# Patient Record
Sex: Male | Born: 1940
Health system: Southern US, Community
[De-identification: ages and names within clinical notes are randomized; demographics above are authoritative.]

---

## 1999-01-03 ENCOUNTER — Ambulatory Visit (HOSPITAL_COMMUNITY): Admission: RE | Admit: 1999-01-03 | Discharge: 1999-01-03 | Payer: Self-pay | Admitting: Internal Medicine

## 1999-05-07 ENCOUNTER — Ambulatory Visit (HOSPITAL_COMMUNITY): Admission: RE | Admit: 1999-05-07 | Discharge: 1999-05-07 | Payer: Self-pay | Admitting: Gastroenterology

## 1999-05-07 ENCOUNTER — Encounter (INDEPENDENT_AMBULATORY_CARE_PROVIDER_SITE_OTHER): Payer: Self-pay

## 2003-11-08 ENCOUNTER — Encounter (INDEPENDENT_AMBULATORY_CARE_PROVIDER_SITE_OTHER): Payer: Self-pay | Admitting: *Deleted

## 2003-11-08 ENCOUNTER — Ambulatory Visit (HOSPITAL_COMMUNITY): Admission: RE | Admit: 2003-11-08 | Discharge: 2003-11-08 | Payer: Self-pay | Admitting: Gastroenterology

## 2007-12-01 ENCOUNTER — Ambulatory Visit (HOSPITAL_COMMUNITY): Admission: RE | Admit: 2007-12-01 | Discharge: 2007-12-01 | Payer: Self-pay | Admitting: Urology

## 2007-12-04 ENCOUNTER — Ambulatory Visit: Admission: RE | Admit: 2007-12-04 | Discharge: 2007-12-22 | Payer: Self-pay | Admitting: Radiation Oncology

## 2008-02-11 ENCOUNTER — Ambulatory Visit: Admission: RE | Admit: 2008-02-11 | Discharge: 2008-03-14 | Payer: Self-pay | Admitting: Radiation Oncology

## 2008-02-24 ENCOUNTER — Inpatient Hospital Stay (HOSPITAL_COMMUNITY): Admission: RE | Admit: 2008-02-24 | Discharge: 2008-02-25 | Payer: Self-pay | Admitting: Urology

## 2008-02-24 ENCOUNTER — Encounter (INDEPENDENT_AMBULATORY_CARE_PROVIDER_SITE_OTHER): Payer: Self-pay | Admitting: Urology

## 2008-12-16 ENCOUNTER — Ambulatory Visit (HOSPITAL_BASED_OUTPATIENT_CLINIC_OR_DEPARTMENT_OTHER): Admission: RE | Admit: 2008-12-16 | Discharge: 2008-12-16 | Payer: Self-pay | Admitting: Urology

## 2010-07-13 LAB — POCT I-STAT 4, (NA,K, GLUC, HGB,HCT)
HCT: 48 % (ref 39.0–52.0)
Hemoglobin: 16.3 g/dL (ref 13.0–17.0)
Potassium: 4.1 mEq/L (ref 3.5–5.1)

## 2010-08-21 NOTE — Discharge Summary (Signed)
NAMEFINNIAN, HUSTED                 ACCOUNT NO.:  [id number]   MEDICAL RECORD NO.:  [id number]          PATIENT TYPE:  INP   LOCATION:  1428                         FACILITY:  [facility]   PHYSICIAN:  Ronald L. Earlene Plater, M.D.  DATE OF BIRTH:  04-Feb-1941   DATE OF ADMISSION:  02/24/2008  DATE OF DISCHARGE:  02/25/2008                               DISCHARGE SUMMARY   ADMISSION DIAGNOSIS:  Adenocarcinoma of the prostate.   DISCHARGE DIAGNOSIS:  Adenocarcinoma of the prostate.   PROCEDURES:  1. Robotic-assisted laparoscopic radical prostatectomy, bilateral      nerve sparing.  2. Bilateral pelvic lymphadenopathy.   HISTORY AND PHYSICAL:  For full details, please see admission History  and Physical.  Briefly, Mr. Azer is a 70 year old gentleman who was  found to have clinically localized adenocarcinoma of the prostate.  After careful consideration regarding management options for treatment,  he elected to proceed with surgical therapy and a robotic-assisted  laparoscopic radical prostatectomy.   HOSPITAL COURSE:  On February 24, 2008, he was taken to the operating  room and underwent a robotic-assisted laparoscopic radical prostatectomy  which he tolerated well and without complications.  Postoperatively, he  was able to be transferred to a regular hospital room following recovery  from anesthesia.  He was able to begin ambulation that evening.  He  remained hemodynamically stable.  His postoperative hematocrit was 38.1.  On the morning of postoperative day #1, his hematocrit was also found to  be stable at 37.0.  He maintained excellent urine output with minimal  output from pelvic drain.  Therefore, the pelvic drain was removed.  He  was placed on a clear liquid diet and continued to ambulate.  His urine  output, blood pressure, and pulse all remained stable.  He was also able  to tolerate his clear liquid diet without nausea or vomiting.  Therefore, he was felt to be stable for discharge  and had met all  discharge criteria.   DISPOSITION:  Home.   DISCHARGE MEDICATIONS:  He was instructed to resume his regular home  medications including Benicar and Niacin.  He was provided a  prescription for Vicodin to take as needed for pain and told to use  Colace as a stool softener.  He was also given a prescription for Cipro.  He was told to hold his aspirin, multivitamins, and herbals for 10 days.   DISCHARGE INSTRUCTIONS:  He was instructed to be ambulatory, but  specifically told to refrain from any heavy lifting, strenuous activity,  or driving.  He was instructed on routine Foley catheter care and told  to gradually advance his diet over the course of the next few days.   FOLLOW-UP:  He will follow up in 1 week for Foley catheter removal and  skin staple removal.      Delia Chimes, NP      Lucrezia Starch. Earlene Plater, M.D.  Electronically Signed    MA/MEDQ  D:  02/25/2008  T:  02/25/2008  Job:  [id number]

## 2010-08-21 NOTE — Op Note (Signed)
NAMEROGERS, DITTER                 ACCOUNT NO.:  [id number]   MEDICAL RECORD NO.:  [id number]          PATIENT TYPE:  INP   LOCATION:  1428                         FACILITY:  [facility]   PHYSICIAN:  Ronald L. Earlene Plater, M.D.  DATE OF BIRTH:  1940/10/23   DATE OF PROCEDURE:  02/24/2008  DATE OF DISCHARGE:                               OPERATIVE REPORT   DIAGNOSIS:  Adenocarcinoma of the prostate.   OPERATIVE PROCEDURE:  Robotic-assisted laparoscopic radical  prostatectomy with bilateral pelvic lymphadenectomy and bilateral nerve  spare.   SURGEON:  Gaynelle Arabian, M.D.   ASSISTANT:  Delia Chimes, N.P.C.   ANESTHESIA:  General endotracheal.   ESTIMATED BLOOD LOSS:  350 mL.   TUBES:  20-French coude Foley catheter and large round Blake drain.   INDICATION FOR PROCEDURE:  Mr. Brinkmeier is a very nice 70 year old white  male who presented with an elevated PSA of 6.87.  He underwent  ultrasound and biopsy of the prostate which revealed a Gleason score 7 =  3 + 4 adenocarcinoma in 60% of the biopsy in the right lateral prostate  and 50% of the right mid prostate area.  He also had Gleason score 6 = 3  + 3 in the left and right apices.  Metastatic workup was negative.  After understanding risks, benefits and alternatives, he elected to  proceed with the above procedure.   PROCEDURE IN DETAIL:  The patient was placed in the supine position.  After proper general endotracheal anesthesia, he was placed in  exaggerated lithotomy position, prepped and draped with Betadine in a  sterile fashion.  A left periumbilical incision was made, and under  direct vision, a 12-mm cannula was placed, and the abdomen was  insufflated with carbon dioxide.  Inspection revealed there were no  significant abnormalities.  The right and left robotic arm ports were  placed with 8 mm cannulas as was a left fourth arm port and then 2  working ports, a 5 mm and a 12 mm right abdominal.  All were placed  under direct  vision, and good hemostasis was present.  The hot shears  were placed in the right working arm, the ERBE bipolar in the left  working arm, and Prograsp was placed in the fourth working arm.  The  median and median umbilical ligaments were taken down, and the space of  Retzius was entered.  Blunt and sharp dissection was carried down to the  prostate.  The endopelvic fascia was incised bilaterally and taken down  to the apex of the prostate.  Puboprostatic ligaments were identified  and coagulated.  The superficial dorsal vein complex was coagulated with  bipolar, and the anterior prostate was cleaned of connective tissue.  The deep dorsal vein was then identified, clamped with an Endovascular  staple and cut.  Next, the bladder neck was approached, and the bladder  neck was taken down from the prostate, carefully avoiding both  structures.  An amp of indigo carmine was given IV.  The bladder neck  was taken off of the posterior prostate.  Seminal vesicles were  dissected out, carefully coagulating bleeders, and the ampulla vas  deferens were excised.  Denonvilliers fascia was then taken down  posteriorly.  Bilateral nerve spare was performed.  The posterior  lateral tissue was taken down, and the pedicles of the prostate were  taken in serial packets and clipped with Hem-o-lok clips and taken down  to the apex of prostate.  Apex of the prostate was then approached.  The  urethra was sharply incised, and the prostate and accompanying  structures were placed in the right lower quadrant.  The pelvis was  irrigated.  Good nerve spare was noted.  Good hemostasis was present,  and the rectum was insufflated with air, and there were no leaks noted.  Right and left pelvic lymphadenectomy were then performed.  The external  iliac and obturator lymph node tissue were clipped proximally and  distally and submitted as right and left sides.  Obturator nerve and  vessels were identified and protected.   The anastomosis was then  approached, and in 6 o'clock position, a 2-0 Vicryl was placed  connecting the bladder neck to the urethra as a holding suture.  Next,  anastomosis was accomplished with a running 3-0 Monocryl suture, both  dyed and undyed, tied posteriorly under the bladder and performed in a  running fashion, approximating the mucosa under direct vision and tied  anteriorly.  A 20-French coude catheter was passed into the bladder, and  the bladder was noted to irrigate clear blue dye.  No leakage was noted  visually.  Reinspection revealed good hemostasis was present.  The right  12-mm working port was closed under direct vision with a suture passer  and 2-0 Vicryl suture.  The specimen was grasped with an EndoCatch bag  through the camera port, and each was port was removed and visually  inspected, and good hemostasis was present.  Specimen was removed  through the periumbilical incision, after the abdomen had been carefully  inspected and a large round Blake drain had been placed through the  fourth arm port and sutured in place with nylon.  The specimen was  submitted to pathology.  The fascia and the periumbilical incision was  closed with running 2-0 Vicryl suture under direct vision.  Irrigation  was performed.  Good hemostasis was noted to be present.  Each wound was  injected with 0.25% Marcaine, closed with skin staples, and the patient  was taken to the recovery room stable.      Ronald L. Earlene Plater, M.D.  Electronically Signed     RLD/MEDQ  D:  02/24/2008  T:  02/25/2008  Job:  [id number]

## 2010-08-24 NOTE — Op Note (Signed)
Ronald Hutchinson, Ronald Hutchinson                           ACCOUNT NO.:  [id number]   MEDICAL RECORD NO.:  [id number]                   PATIENT TYPE:  AMB   LOCATION:  ENDO                                 FACILITY:  [facility]   PHYSICIAN:  John C. Madilyn Fireman, M.D.                 DATE OF BIRTH:  1940/10/04   DATE OF PROCEDURE:  11/08/2003  DATE OF DISCHARGE:                                 OPERATIVE REPORT   PROCEDURE:  Colonoscopy with polypectomy.   INDICATIONS FOR PROCEDURE:  History of adenomatous colon polyps with last  colonoscopy five years ago.   DESCRIPTION OF PROCEDURE:  The patient was placed in the left lateral  decubitus position then placed on the pulse monitor with continuous low flow  oxygen delivered by nasal cannula. He was sedated with 62.5 mcg IV fentanyl  and 8 mg IV Versed. The Olympus video colonoscope was inserted into the  rectum and advanced to the cecum, confirmed by transillumination at  McBurney's point and visualization of the ileocecal valve and appendiceal  orifice. The prep was excellent. The cecum and ascending colon all appeared  normal with no masses, polyps, diverticula or other mucosal abnormalities.  Within the transverse colon, there was seen an 8 mm sessile polyp that was  removed by snare. The remainder of the transverse colon appeared normal.  In  the descending and sigmoid colon, there were several scattered diverticula  with no other abnormalities.  The rectum appeared normal and retroflexed  view of the anus revealed no obvious internal hemorrhoids. The colonoscope  was then withdrawn and the patient returned to the recovery room in stable  condition. She tolerated the procedure well and there were no immediate  complications.   IMPRESSION:  1. Transverse colon polyp.  2. Left sided diverticulosis.   PLAN:  Await histology and probably repeat colonoscopy in five years.                                               John C. Madilyn Fireman, M.D.    JCH/MEDQ  D:   11/08/2003  T:  11/08/2003  Job:  [id number]   cc:   Geoffry Paradise, M.D.  [address]  [place]  [place] [address]  Fax: [phone]

## 2011-01-08 LAB — PROTIME-INR
INR: 1
Prothrombin Time: 13.4

## 2011-01-08 LAB — BASIC METABOLIC PANEL
BUN: 13
BUN: 8
CO2: 26
CO2: 28
Calcium: 8.5
GFR calc Af Amer: >60
GFR calc Af Amer: >60
GFR calc non Af Amer: >60
Glucose, Bld: 128 — ABNORMAL HIGH
Potassium: 3.6

## 2011-01-08 LAB — COMPREHENSIVE METABOLIC PANEL
ALT: 17
Albumin: 3.7
Alkaline Phosphatase: 42
BUN: 19
Calcium: 9.6
Chloride: 104
GFR calc Af Amer: >60
GFR calc non Af Amer: >60
Total Bilirubin: 1.1
Total Protein: 7.2

## 2011-01-08 LAB — APTT: aPTT: 27

## 2011-01-08 LAB — TYPE AND SCREEN: ABO/RH(D): B POS

## 2011-01-08 LAB — CBC
HCT: 37 — ABNORMAL LOW
HCT: 38.1 — ABNORMAL LOW
HCT: 43.4
Hemoglobin: 14.6
MCV: 89.2
Platelets: 127 — ABNORMAL LOW
Platelets: 129 — ABNORMAL LOW
Platelets: 156
RBC: 4.31
RDW: 12
RDW: 12.7

## 2011-01-08 LAB — DIFFERENTIAL
Basophils Relative: 0
Basophils Relative: 0
Eosinophils Relative: 0
Lymphocytes Relative: 17
Lymphs Abs: 1.5
Monocytes Absolute: 1
Monocytes Relative: 11
Neutro Abs: 6.7
Neutrophils Relative %: 72

## 2011-01-08 LAB — ABO/RH: ABO/RH(D): B POS

## 2012-08-18 ENCOUNTER — Other Ambulatory Visit: Payer: Self-pay | Admitting: Gastroenterology

## 2012-11-05 ENCOUNTER — Other Ambulatory Visit: Payer: Self-pay | Admitting: Dermatology

## 2014-07-15 ENCOUNTER — Other Ambulatory Visit: Payer: Self-pay | Admitting: Internal Medicine

## 2014-07-15 ENCOUNTER — Other Ambulatory Visit: Payer: Self-pay | Admitting: *Deleted

## 2014-07-15 DIAGNOSIS — M5416 Radiculopathy, lumbar region: Secondary | ICD-10-CM

## 2014-07-27 ENCOUNTER — Other Ambulatory Visit: Payer: Self-pay

## 2014-08-01 ENCOUNTER — Ambulatory Visit
Admission: RE | Admit: 2014-08-01 | Discharge: 2014-08-01 | Disposition: A | Discharge status: Home / Self Care | Payer: Self-pay | Source: Ambulatory Visit | Attending: Internal Medicine | Admitting: Internal Medicine

## 2014-08-01 ENCOUNTER — Ambulatory Visit
Admission: RE | Admit: 2014-08-01 | Discharge: 2014-08-01 | Disposition: A | Discharge status: Home / Self Care | Payer: Medicare Other | Source: Ambulatory Visit | Attending: Internal Medicine | Admitting: Internal Medicine

## 2014-08-01 DIAGNOSIS — M5416 Radiculopathy, lumbar region: Secondary | ICD-10-CM

## 2014-08-11 ENCOUNTER — Other Ambulatory Visit: Payer: Self-pay | Admitting: Internal Medicine

## 2014-08-11 DIAGNOSIS — M5416 Radiculopathy, lumbar region: Secondary | ICD-10-CM

## 2014-08-16 ENCOUNTER — Other Ambulatory Visit: Payer: Self-pay | Admitting: Internal Medicine

## 2014-08-16 ENCOUNTER — Ambulatory Visit
Admission: RE | Admit: 2014-08-16 | Discharge: 2014-08-16 | Disposition: A | Discharge status: Home / Self Care | Payer: Medicare Other | Source: Ambulatory Visit | Attending: Internal Medicine | Admitting: Internal Medicine

## 2014-08-16 DIAGNOSIS — M5416 Radiculopathy, lumbar region: Secondary | ICD-10-CM

## 2014-08-16 MED ORDER — METHYLPREDNISOLONE ACETATE 40 MG/ML INJ SUSP (RADIOLOG
120.0000 mg | Freq: Once | INTRAMUSCULAR | Status: AC
  Administered 2014-08-16: 120 mg via EPIDURAL

## 2014-08-16 MED ORDER — IOHEXOL 180 MG/ML  SOLN
1.0000 mL | Freq: Once | INTRAMUSCULAR | Status: AC | PRN
  Administered 2014-08-16: 1 mL via EPIDURAL

## 2014-08-16 NOTE — Discharge Instructions (Signed)

## 2014-11-18 ENCOUNTER — Other Ambulatory Visit: Payer: Self-pay | Admitting: Internal Medicine

## 2014-11-18 DIAGNOSIS — M5416 Radiculopathy, lumbar region: Secondary | ICD-10-CM

## 2014-11-21 ENCOUNTER — Ambulatory Visit
Admission: RE | Admit: 2014-11-21 | Discharge: 2014-11-21 | Disposition: A | Discharge status: Home / Self Care | Payer: Medicare Other | Source: Ambulatory Visit | Attending: Internal Medicine | Admitting: Internal Medicine

## 2014-11-21 ENCOUNTER — Other Ambulatory Visit: Payer: Self-pay | Admitting: Internal Medicine

## 2014-11-21 DIAGNOSIS — M5416 Radiculopathy, lumbar region: Secondary | ICD-10-CM

## 2014-11-21 MED ORDER — IOHEXOL 180 MG/ML  SOLN
1.0000 mL | Freq: Once | INTRAMUSCULAR | Status: DC | PRN
  Administered 2014-11-21: 1 mL via EPIDURAL

## 2014-11-21 MED ORDER — METHYLPREDNISOLONE ACETATE 40 MG/ML INJ SUSP (RADIOLOG
120.0000 mg | Freq: Once | INTRAMUSCULAR | Status: AC
  Administered 2014-11-21: 120 mg via EPIDURAL

## 2014-11-21 NOTE — Discharge Instructions (Signed)

## 2018-01-14 ENCOUNTER — Emergency Department (HOSPITAL_COMMUNITY): Payer: Medicare Other

## 2018-01-14 ENCOUNTER — Emergency Department (HOSPITAL_COMMUNITY)
Admission: EM | Admit: 2018-01-14 | Discharge: 2018-01-14 | Disposition: A | Discharge status: Home / Self Care | Payer: Medicare Other | Attending: Emergency Medicine | Admitting: Emergency Medicine

## 2018-01-14 DIAGNOSIS — H538 Other visual disturbances: Secondary | ICD-10-CM | POA: Diagnosis not present

## 2018-01-14 DIAGNOSIS — W19XXXA Unspecified fall, initial encounter: Secondary | ICD-10-CM | POA: Insufficient documentation

## 2018-01-14 DIAGNOSIS — Z79899 Other long term (current) drug therapy: Secondary | ICD-10-CM | POA: Diagnosis not present

## 2018-01-14 DIAGNOSIS — S43005A Unspecified dislocation of left shoulder joint, initial encounter: Secondary | ICD-10-CM | POA: Diagnosis present

## 2018-01-14 DIAGNOSIS — Z7982 Long term (current) use of aspirin: Secondary | ICD-10-CM | POA: Diagnosis not present

## 2018-01-14 DIAGNOSIS — Y939 Activity, unspecified: Secondary | ICD-10-CM | POA: Diagnosis not present

## 2018-01-14 DIAGNOSIS — Y929 Unspecified place or not applicable: Secondary | ICD-10-CM | POA: Diagnosis not present

## 2018-01-14 DIAGNOSIS — Y999 Unspecified external cause status: Secondary | ICD-10-CM | POA: Insufficient documentation

## 2018-01-14 MED ORDER — ACETAMINOPHEN 325 MG PO TABS: 650.0000 mg | ORAL_TABLET | Freq: Four times a day (QID) | ORAL | 0 refills | Status: AC | PRN

## 2018-01-14 MED ORDER — KETAMINE HCL 50 MG/5ML IJ SOSY
0.3000 mg/kg | PREFILLED_SYRINGE | Freq: Once | INTRAMUSCULAR | Status: AC
Start: 2018-01-14 — End: 2018-01-14
  Administered 2018-01-14: 24 mg via INTRAVENOUS
  Filled 2018-01-14: qty 5

## 2018-01-14 MED ORDER — ONDANSETRON HCL 4 MG/2ML IJ SOLN
4.0000 mg | Freq: Once | INTRAMUSCULAR | Status: AC
  Administered 2018-01-14: 4 mg via INTRAVENOUS
  Filled 2018-01-14: qty 2

## 2018-01-14 MED ORDER — FENTANYL CITRATE (PF) 100 MCG/2ML IJ SOLN
75.0000 ug | Freq: Once | INTRAMUSCULAR | Status: AC
Start: 2018-01-14 — End: 2018-01-14
  Administered 2018-01-14: 75 ug via INTRAVENOUS
  Filled 2018-01-14: qty 2

## 2018-01-14 MED ORDER — PROPOFOL 10 MG/ML IV BOLUS
INTRAVENOUS | Status: AC
  Filled 2018-01-14: qty 20

## 2018-01-14 MED ORDER — KETAMINE HCL 50 MG/5ML IJ SOSY
0.5000 mg/kg | PREFILLED_SYRINGE | Freq: Once | INTRAMUSCULAR | Status: AC | PRN
  Administered 2018-01-14: 80 mg via INTRAVENOUS
  Filled 2018-01-14: qty 5

## 2018-01-14 MED ORDER — HYDROCODONE-ACETAMINOPHEN 5-325 MG PO TABS: 1.0000 | ORAL_TABLET | Freq: Two times a day (BID) | ORAL | 0 refills | Status: AC | PRN

## 2018-01-14 NOTE — Discharge Instructions (Addendum)
See Dr. Jena Gauss in 1 week. Read the instructions on the dislocation.

## 2018-01-14 NOTE — ED Provider Notes (Addendum)
[facility] EMERGENCY DEPARTMENT Provider Note   CSN: [id number] Arrival date & time: 01/14/18  1302     History   Chief Complaint Chief Complaint  Patient presents with  . Shoulder Injury    HPI Ronald Hutchinson is a 77 y.o. male.  HPI  77 year old male comes in with chief complaint of shoulder injury.  Patient has no significant medical history and reports that he was riding his tractor when he fell down.  Patient fell onto his left side and is complaining of severe shoulder pain.  He denies any associated numbness, tingling.  Patient also is unsure if he is having pain anywhere else.  No past medical history on file.  There are no active problems to display for this patient.        Home Medications    Prior to Admission medications   Medication Sig Start Date End Date Taking? Authorizing Provider  Ascorbic Acid (VITAMIN C PO) Take 1 tablet by mouth daily.   Yes [provider]  aspirin EC 81 MG tablet Take 40.5 mg by mouth daily.   Yes [provider]  Cholecalciferol (VITAMIN D PO) Take 1 capsule by mouth daily.   Yes [provider]  CINNAMON PO Take 1 capsule by mouth daily.   Yes [provider]  Coenzyme Q10 (COQ-10 PO) Take 1 capsule by mouth daily.   Yes [provider]  losartan-hydrochlorothiazide (HYZAAR) 50-12.5 MG tablet Take 1 tablet by mouth daily. 11/07/17  Yes [provider]  metFORMIN (GLUCOPHAGE-XR) 500 MG 24 hr tablet Take 500 mg by mouth 2 (two) times daily. 11/13/17  Yes [provider]  NIACIN PO Take 1 tablet by mouth daily.   Yes [provider]  Omega-3 Fatty Acids (FISH OIL) 1000 MG CAPS Take 1,000 mg by mouth daily.   Yes [provider]    Family History No family history on file.  Social History Social History   Tobacco Use  . Smoking status: Not on file  Substance Use Topics  . Alcohol use: Not on file  . Drug use: Not on file      Allergies   Patient has no known allergies.   Review of Systems Review of Systems  Unable to perform ROS: Acuity of condition  Constitutional: Positive for activity change.  Musculoskeletal: Positive for arthralgias.  Skin: Negative for wound.  Neurological: Negative for numbness.  Hematological: Does not bruise/bleed easily.     Physical Exam Updated Vital Signs BP (!) 167/98   Pulse 97   Temp 97.6 F (36.4 C) (Oral)   Resp 18   Ht 5\' 4"  (1.626 m)   Wt 80.3 kg   SpO2 100%   BMI 30.38 kg/m   Physical Exam  Constitutional: He is oriented to person, place, and time. He appears well-developed. He appears distressed.  Secondary to excruciating pain in his left shoulder  HENT:  Head: Atraumatic.  Neck: Neck supple.  Cardiovascular: Normal rate and intact distal pulses.  Pulmonary/Chest: Effort normal.  Musculoskeletal: He exhibits edema, tenderness and deformity.  Neurological: He is alert and oriented to person, place, and time.  Gross sensory exam appears normal for both extremities  Skin: Skin is warm.  Nursing note and vitals reviewed.    ED Treatments / Results  Labs (all labs ordered are listed, but only abnormal results are displayed) Labs Reviewed  BASIC METABOLIC PANEL  CBC WITH DIFFERENTIAL/PLATELET    EKG None  Radiology Dg Shoulder  Left Portable  Result Date: 01/14/2018 CLINICAL DATA:  The patient suffered a fall getting off a tractor today. Left shoulder injury and pain. Initial encounter. EXAM: LEFT SHOULDER - 1 VIEW COMPARISON:  None. FINDINGS: The left humerus is anteriorly dislocated. No fracture is identified. Mild to moderate acromioclavicular and glenohumeral osteoarthritis is seen. IMPRESSION: Anterior left shoulder dislocation. Degenerative disease about the shoulder. Electronically Signed   By: Drusilla Kanner M.D.   On: 01/14/2018 14:38    Procedures .Sedation Date/Time: 01/14/2018 2:45 PM Performed by: Derwood Kaplan,  MD Authorized by: Derwood Kaplan, MD   Consent:    Consent obtained:  Verbal   Consent given by:  Patient and spouse   Risks discussed:  Allergic reaction, dysrhythmia, inadequate sedation, nausea, prolonged hypoxia resulting in organ damage, prolonged sedation necessitating reversal, respiratory compromise necessitating ventilatory assistance and intubation and vomiting   Alternatives discussed:  Analgesia without sedation, anxiolysis and regional anesthesia Universal protocol:    Procedure explained and questions answered to patient or proxy's satisfaction: yes     Immediately prior to procedure a time out was called: yes     Patient identity confirmation method:  Verbally with patient Indications:    Procedure necessitating sedation performed by:  Physician performing sedation Pre-sedation assessment:    Time since last food or drink:  > 2 hours   ASA classification: class 1 - normal, healthy patient     Neck mobility: normal     Mouth opening:  3 or more finger widths   Thyromental distance:  4 finger widths   Mallampati score:  I - soft palate, uvula, fauces, pillars visible   Pre-sedation assessments completed and reviewed: airway patency, cardiovascular function, hydration status, mental status, nausea/vomiting, pain level, respiratory function and temperature     Pre-sedation assessment completed:  01/14/2018 2:40 PM Immediate pre-procedure details:    Reassessment: Patient reassessed immediately prior to procedure     Reviewed: vital signs, relevant labs/tests and NPO status     Verified: bag valve mask available, emergency equipment available, intubation equipment available, IV patency confirmed, oxygen available and suction available   Procedure details (see MAR for exact dosages):    Preoxygenation:  Nasal cannula   Sedation:  Ketamine   Intra-procedure monitoring:  Blood pressure monitoring, cardiac monitor, continuous pulse oximetry, frequent LOC assessments, frequent  vital sign checks and continuous capnometry   Intra-procedure events: none     Intra-procedure management:  Supplemental oxygen and BVM ventilation   Total Provider sedation time (minutes):  32 Post-procedure details:    Post-sedation assessment completed:  01/14/2018 3:14 PM   Attendance: Constant attendance by certified staff until patient recovered     Recovery: Patient returned to pre-procedure baseline     Post-sedation assessments completed and reviewed: airway patency, cardiovascular function, hydration status, mental status, nausea/vomiting, pain level, respiratory function and temperature     Patient is stable for discharge or admission: yes     Patient tolerance:  Tolerated well, no immediate complications Comments:     Patient now waking up. Required about 1 minute of BVM.  Reduction of dislocation Date/Time: 01/14/2018 3:15 PM Performed by: Derwood Kaplan, MD Authorized by: Derwood Kaplan, MD  Consent: Verbal consent obtained. Risks and benefits: risks, benefits and alternatives were discussed Consent given by: patient Patient understanding: patient states understanding of the procedure being performed Site marked: the operative site was marked Imaging studies: imaging studies available Patient identity confirmed: arm band Time out: Immediately prior to procedure a "time out"  was called to verify the correct patient, procedure, equipment, support staff and site/side marked as required. Local anesthesia used: no  Anesthesia: Local anesthesia used: no Patient tolerance: Patient tolerated the procedure well with no immediate complications    (including critical care time)  Medications Ordered in ED Medications  ketamine 50 mg in normal saline 5 mL (10 mg/mL) syringe (has no administration in time range)  fentaNYL (SUBLIMAZE) injection 75 mcg (75 mcg Intravenous Given 01/14/18 1336)  ketamine 50 mg in normal saline 5 mL (10 mg/mL) syringe (24 mg Intravenous Given  01/14/18 1442)     Initial Impression / Assessment and Plan / ED Course  I have reviewed the triage vital signs and the nursing notes.  Pertinent labs & imaging results that were available during my care of the patient were reviewed by me and considered in my medical decision making (see chart for details).  Clinical Course as of Jan 17 1626  Wed Jan 14, 2018  1515 Shoulder reduced.  Patient will now get CT scan of his head and C-spine.  Once he is back to baseline and ambulating normally, he will be ready for discharge   [AN]  1830 Patient feels stable at this time, he is able to ambulate.  Will discharge him.   [AN]    Clinical Course User Index [AN] Derwood Kaplan, MD    77 year old male comes in with chief complaint of shoulder pain.  He had a mechanical fall from his tractor earlier today.  Patient fell onto his left side and is coming in tearful with severe pain.  Patient is already received medication for paramedics without significant relief.  He denies any new numbness, tingling and does not feel like he has pain elsewhere.  Patient is unsure if he had loss of consciousness.  CT head, C-spine will be needed -given that he cannot be clinically cleared based on any decision-making tool for significant brain injury or spine injury.  We suspect shoulder dislocation or fracture.   Final Clinical Impressions(s) / ED Diagnoses   Final diagnoses:  Dislocation of left shoulder joint, initial encounter    ED Discharge Orders    None       Derwood Kaplan, MD 01/14/18 1516    Derwood Kaplan, MD 01/17/18 2956

## 2018-01-14 NOTE — Progress Notes (Signed)
RT placed pt on [place] with ETCO2 monitoring for a conscious sedation procedure.  Pt's RR dropped for a few minutes, RT used ambu bag to assist with ventilation until the pt began to wake up from the sedation.  Once pt more awake, pt remained on [place] 3 L.

## 2018-01-14 NOTE — ED Notes (Signed)
Patient is resting with call bell in reach and family at bedside 

## 2018-01-14 NOTE — ED Notes (Signed)
Pt CAOx4 and given oral fluids.

## 2018-01-14 NOTE — ED Triage Notes (Addendum)
Pt arrived via POV, driven by wife. Pt was exiting his tractor and was attempting to close farm gate when he fell, causing injury to left shoulder. Cause of fall unknown at time of triage. Pt was seen by the FD but declined EMS transport. Sling placed by FD on scene. Pt denies LOC or head injury. Pt tearful at time of triage. Wife at bedside.

## 2018-01-14 NOTE — ED Notes (Signed)
Patient transported to CT 

## 2019-04-28 ENCOUNTER — Ambulatory Visit: Payer: Medicare PPO | Attending: Internal Medicine

## 2019-04-28 DIAGNOSIS — Z23 Encounter for immunization: Secondary | ICD-10-CM

## 2019-04-28 NOTE — Progress Notes (Signed)
   Covid-19 Vaccination Clinic  Name:  Ronald Hutchinson    MRN: [id number] DOB: 1940/10/24  04/28/2019  [name] was observed post Covid-19 immunization for 15 minutes without incidence. He was provided with Vaccine Information Sheet and instruction to access the V-Safe system.   [name] was instructed to call 911 with any severe reactions post vaccine: Marland Kitchen Difficulty breathing  . Swelling of your face and throat  . A fast heartbeat  . A bad rash all over your body  . Dizziness and weakness    Immunizations Administered    Name Date Dose VIS Date Route   Pfizer COVID-19 Vaccine 04/28/2019  8:56 AM 0.3 mL 03/19/2019 Intramuscular   Manufacturer: [employer], [employer]   Lot: [id number]   NDC: [id number]

## 2019-05-19 ENCOUNTER — Ambulatory Visit: Payer: Medicare PPO | Attending: Internal Medicine

## 2019-05-19 DIAGNOSIS — Z23 Encounter for immunization: Secondary | ICD-10-CM

## 2019-05-19 NOTE — Progress Notes (Signed)
   Covid-19 Vaccination Clinic  Name:  Ronald Hutchinson    MRN: [id number] DOB: [birth year]  05/19/2019  Ronald Hutchinson was observed post Covid-19 immunization for 15 minutes without incidence. He was provided with Vaccine Information Sheet and instruction to access the V-Safe system.   Ronald Hutchinson was instructed to call 911 with any severe reactions post vaccine: Marland Kitchen Difficulty breathing  . Swelling of your face and throat  . A fast heartbeat  . A bad rash all over your body  . Dizziness and weakness    Immunizations Administered    Name Date Dose VIS Date Route   Pfizer COVID-19 Vaccine 05/19/2019 10:41 AM 0.3 mL 03/19/2019 Intramuscular   Manufacturer: [employer], [employer]   Lot: [id number]   NDC: [address]-5

## 2019-07-19 DIAGNOSIS — J309 Allergic rhinitis, unspecified: Secondary | ICD-10-CM | POA: Diagnosis not present

## 2019-07-19 DIAGNOSIS — E669 Obesity, unspecified: Secondary | ICD-10-CM | POA: Diagnosis not present

## 2019-07-19 DIAGNOSIS — M1990 Unspecified osteoarthritis, unspecified site: Secondary | ICD-10-CM | POA: Diagnosis not present

## 2019-07-19 DIAGNOSIS — E1169 Type 2 diabetes mellitus with other specified complication: Secondary | ICD-10-CM | POA: Diagnosis not present

## 2019-07-19 DIAGNOSIS — I10 Essential (primary) hypertension: Secondary | ICD-10-CM | POA: Diagnosis not present

## 2019-07-28 DIAGNOSIS — L821 Other seborrheic keratosis: Secondary | ICD-10-CM | POA: Diagnosis not present

## 2019-07-28 DIAGNOSIS — Z85828 Personal history of other malignant neoplasm of skin: Secondary | ICD-10-CM | POA: Diagnosis not present

## 2019-07-28 DIAGNOSIS — L570 Actinic keratosis: Secondary | ICD-10-CM | POA: Diagnosis not present

## 2019-11-03 DIAGNOSIS — E7800 Pure hypercholesterolemia, unspecified: Secondary | ICD-10-CM | POA: Diagnosis not present

## 2019-11-04 DIAGNOSIS — E7849 Other hyperlipidemia: Secondary | ICD-10-CM | POA: Diagnosis not present

## 2019-11-04 DIAGNOSIS — E1169 Type 2 diabetes mellitus with other specified complication: Secondary | ICD-10-CM | POA: Diagnosis not present

## 2019-11-04 DIAGNOSIS — I10 Essential (primary) hypertension: Secondary | ICD-10-CM | POA: Diagnosis not present

## 2020-01-11 DIAGNOSIS — Z9849 Cataract extraction status, unspecified eye: Secondary | ICD-10-CM | POA: Diagnosis not present

## 2020-01-11 DIAGNOSIS — H35033 Hypertensive retinopathy, bilateral: Secondary | ICD-10-CM | POA: Diagnosis not present

## 2020-01-11 DIAGNOSIS — I10 Essential (primary) hypertension: Secondary | ICD-10-CM | POA: Diagnosis not present

## 2020-01-11 DIAGNOSIS — H524 Presbyopia: Secondary | ICD-10-CM | POA: Diagnosis not present

## 2020-01-11 DIAGNOSIS — H5203 Hypermetropia, bilateral: Secondary | ICD-10-CM | POA: Diagnosis not present

## 2020-01-11 DIAGNOSIS — E119 Type 2 diabetes mellitus without complications: Secondary | ICD-10-CM | POA: Diagnosis not present

## 2020-01-11 DIAGNOSIS — Z961 Presence of intraocular lens: Secondary | ICD-10-CM | POA: Diagnosis not present

## 2020-01-11 DIAGNOSIS — H52223 Regular astigmatism, bilateral: Secondary | ICD-10-CM | POA: Diagnosis not present

## 2020-01-27 DIAGNOSIS — L570 Actinic keratosis: Secondary | ICD-10-CM | POA: Diagnosis not present

## 2020-01-27 DIAGNOSIS — D1801 Hemangioma of skin and subcutaneous tissue: Secondary | ICD-10-CM | POA: Diagnosis not present

## 2020-01-27 DIAGNOSIS — L821 Other seborrheic keratosis: Secondary | ICD-10-CM | POA: Diagnosis not present

## 2020-01-27 DIAGNOSIS — Z85828 Personal history of other malignant neoplasm of skin: Secondary | ICD-10-CM | POA: Diagnosis not present

## 2020-02-09 DIAGNOSIS — E7800 Pure hypercholesterolemia, unspecified: Secondary | ICD-10-CM | POA: Diagnosis not present

## 2020-02-09 DIAGNOSIS — Z125 Encounter for screening for malignant neoplasm of prostate: Secondary | ICD-10-CM | POA: Diagnosis not present

## 2020-02-09 DIAGNOSIS — E1169 Type 2 diabetes mellitus with other specified complication: Secondary | ICD-10-CM | POA: Diagnosis not present

## 2020-02-16 DIAGNOSIS — Z0000 Encounter for general adult medical examination without abnormal findings: Secondary | ICD-10-CM | POA: Diagnosis not present

## 2020-02-16 DIAGNOSIS — E669 Obesity, unspecified: Secondary | ICD-10-CM | POA: Diagnosis not present

## 2020-02-16 DIAGNOSIS — I10 Essential (primary) hypertension: Secondary | ICD-10-CM | POA: Diagnosis not present

## 2020-02-16 DIAGNOSIS — N529 Male erectile dysfunction, unspecified: Secondary | ICD-10-CM | POA: Diagnosis not present

## 2020-02-16 DIAGNOSIS — M1990 Unspecified osteoarthritis, unspecified site: Secondary | ICD-10-CM | POA: Diagnosis not present

## 2020-02-16 DIAGNOSIS — C61 Malignant neoplasm of prostate: Secondary | ICD-10-CM | POA: Diagnosis not present

## 2020-02-16 DIAGNOSIS — R82998 Other abnormal findings in urine: Secondary | ICD-10-CM | POA: Diagnosis not present

## 2020-02-16 DIAGNOSIS — M5416 Radiculopathy, lumbar region: Secondary | ICD-10-CM | POA: Diagnosis not present

## 2020-02-16 DIAGNOSIS — E1169 Type 2 diabetes mellitus with other specified complication: Secondary | ICD-10-CM | POA: Diagnosis not present

## 2020-02-16 DIAGNOSIS — J309 Allergic rhinitis, unspecified: Secondary | ICD-10-CM | POA: Diagnosis not present

## 2020-03-01 DIAGNOSIS — Z1212 Encounter for screening for malignant neoplasm of rectum: Secondary | ICD-10-CM | POA: Diagnosis not present

## 2020-04-08 IMAGING — CT CT CERVICAL SPINE W/O CM
3 of 4 series · 13 of 33 positions shown, 16 images · non-contrast
Comparison: None.

CLINICAL DATA: Fall off tractor.  No loss of consciousness.

EXAM:
CT HEAD WITHOUT CONTRAST
CT CERVICAL SPINE WITHOUT CONTRAST
TECHNIQUE: Multidetector CT imaging of the head and cervical spine was
performed following the standard protocol without intravenous
contrast. Multiplanar CT image reconstructions of the cervical spine
were also generated.

[Series 4: c_spine 2.0 st · axial · 0.28mm/px · z∈[-244,-124]mm · 5 of 91 slices shown, 7 images]
[im 16/91  soft-tissue]
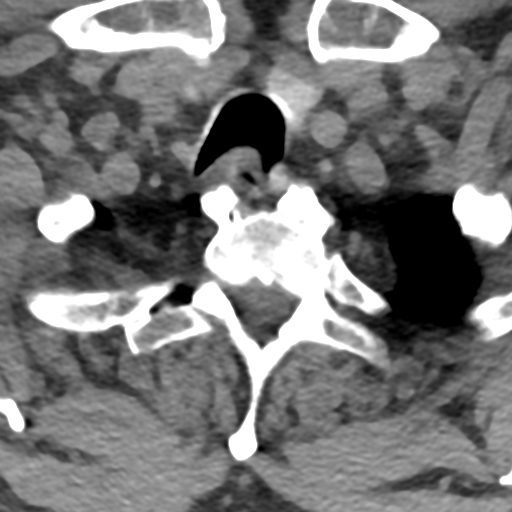
[im 16/91  bone]
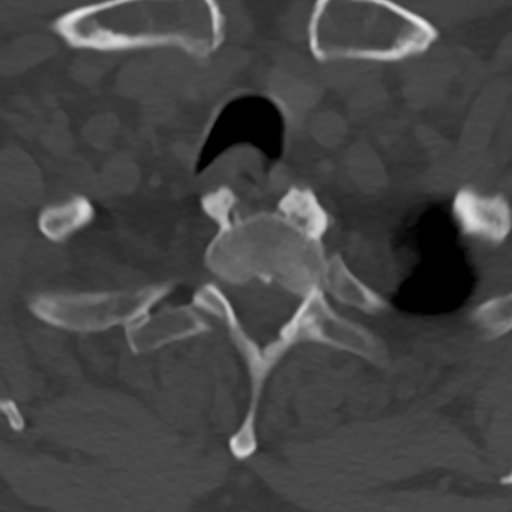
[im 31/91  bone]
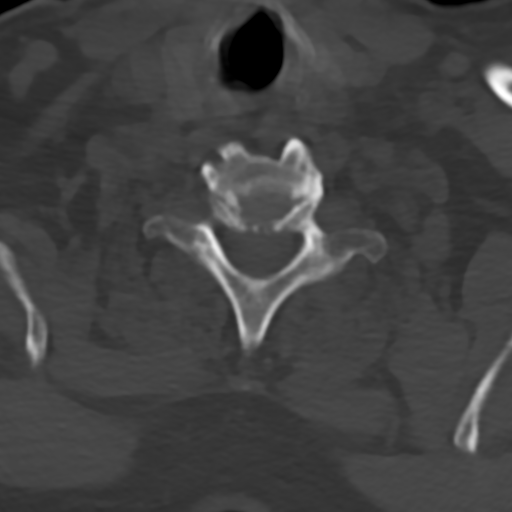
[im 46/91  bone]
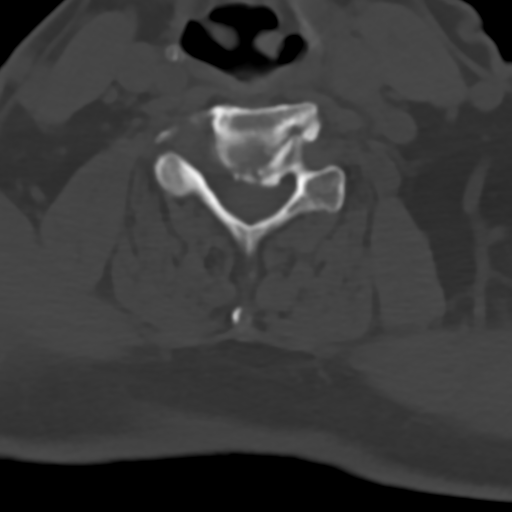
[im 61/91  bone]
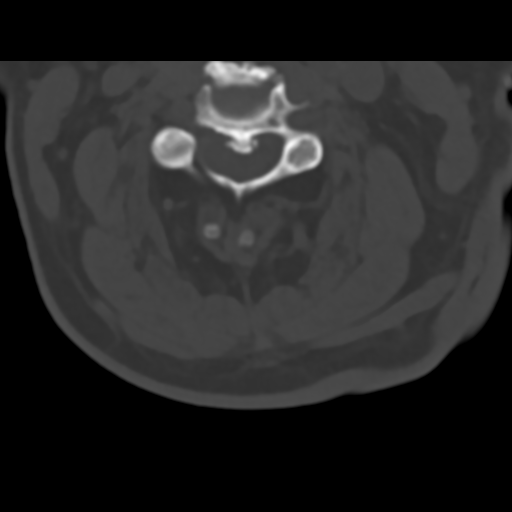
[im 76/91  soft-tissue]
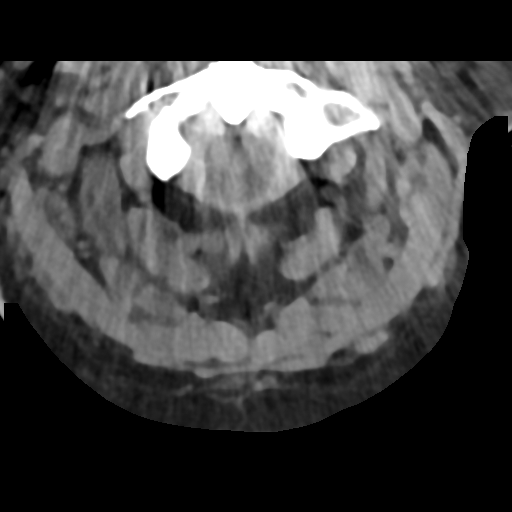
[im 76/91  bone]
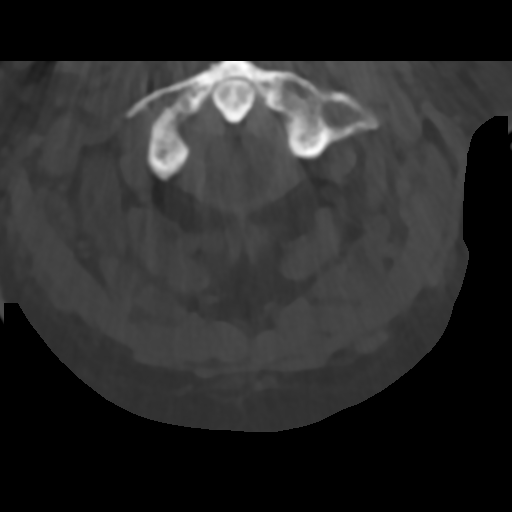

[Series 8: c_spine 2.0 sag bone · sagittal · 0.27mm/px · 5 of 61 slices shown, 6 images]
[im 21/61  bone]
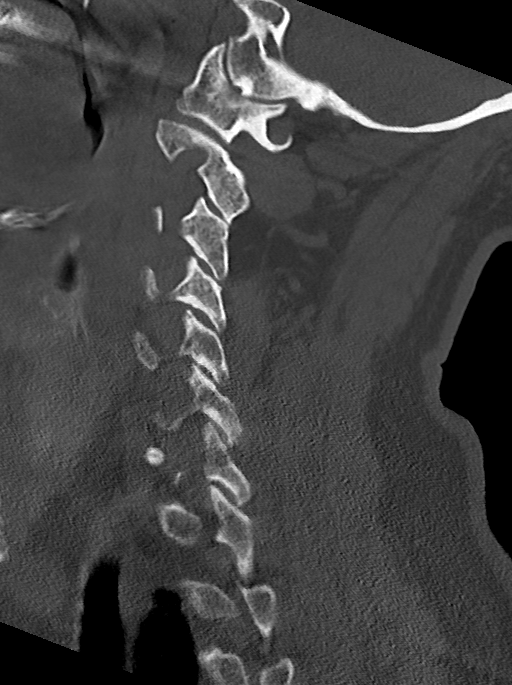
[im 26/61  bone]
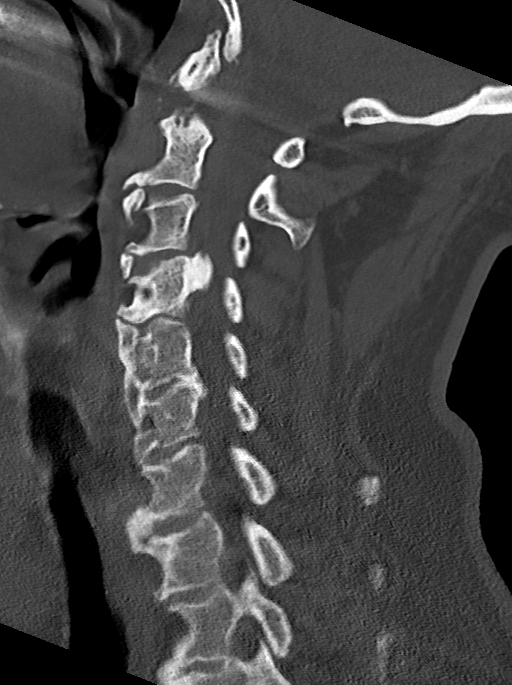
[im 31/61  soft-tissue]
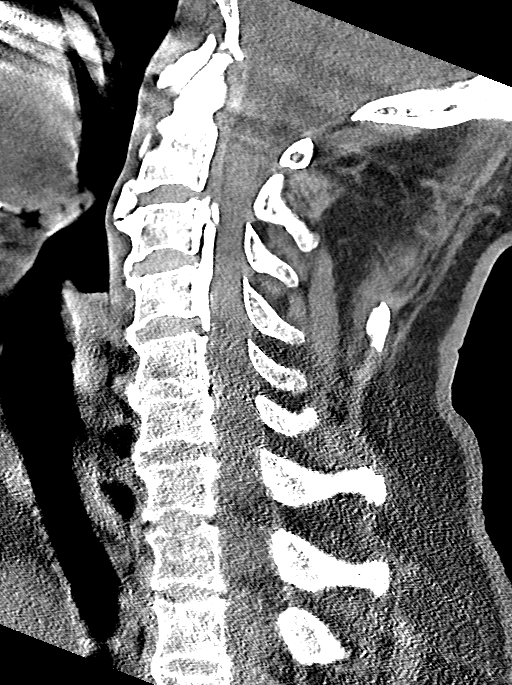
[im 31/61  bone]
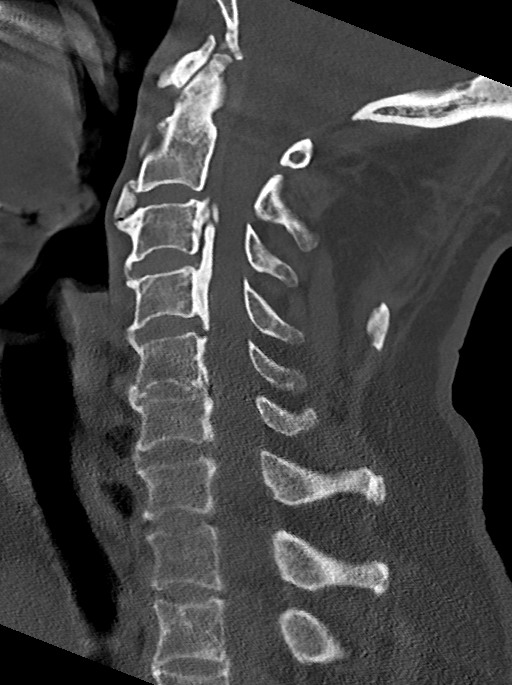
[im 36/61  bone]
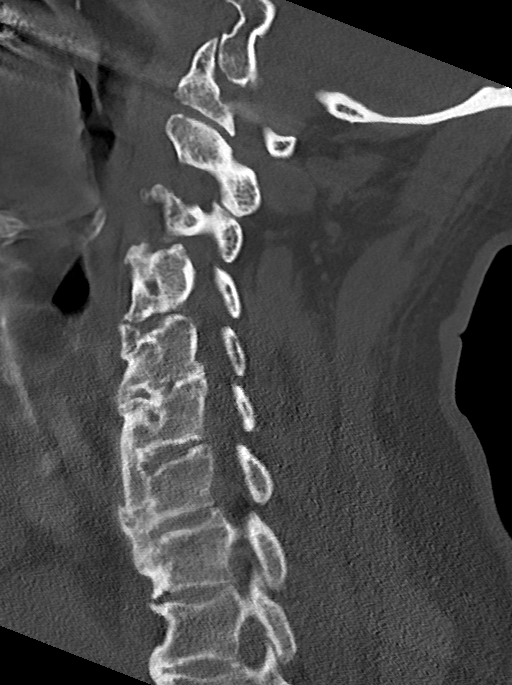
[im 41/61  bone]
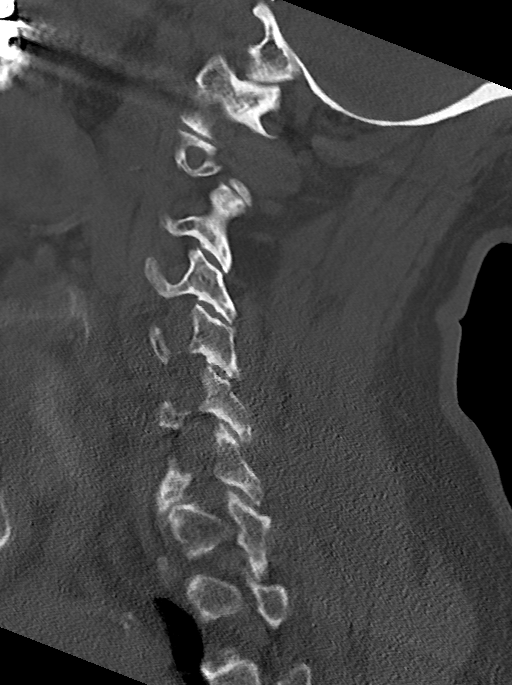

[Series 9: c_spine 2.0 cor bone · coronal · 0.23mm/px · 3 of 69 slices shown]
[im 14/69  bone]
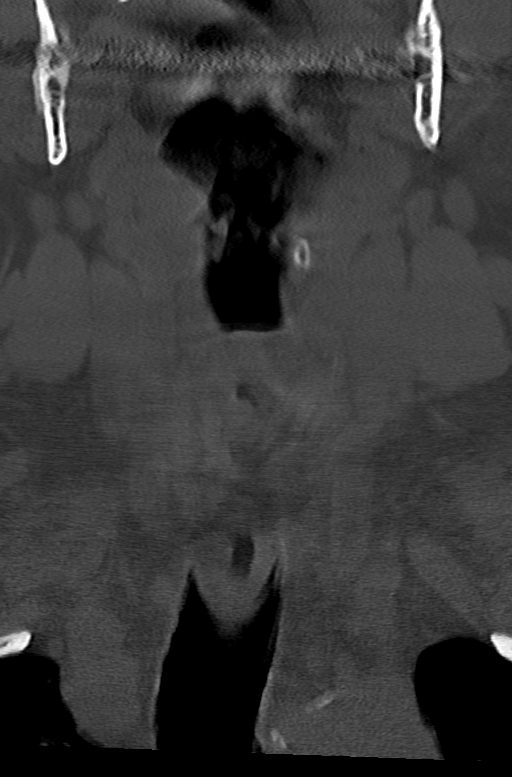
[im 28/69  bone]
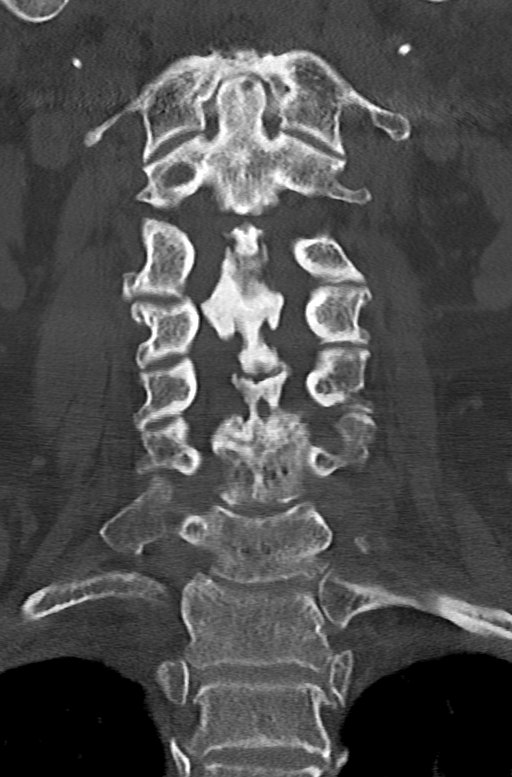
[im 41/69  bone]
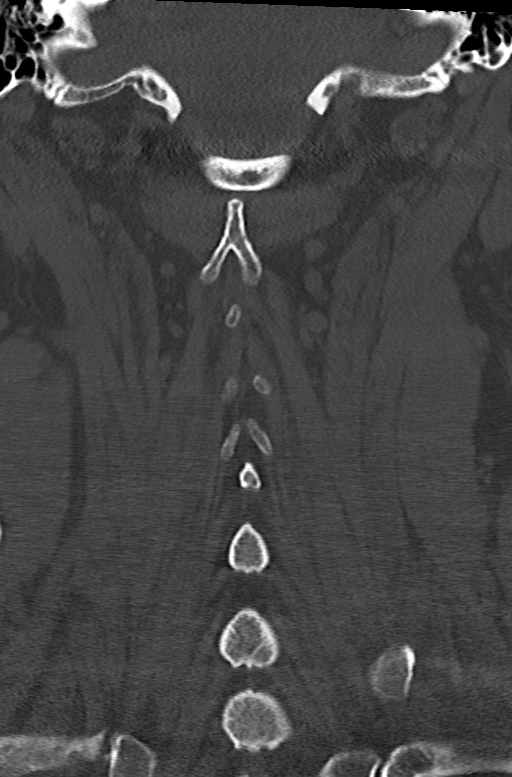

[13 of 33 positions shown; findings below may reference images not displayed]

FINDINGS: CT HEAD FINDINGS

Brain: No evidence of acute infarction, hemorrhage, hydrocephalus,
extra-axial collection or mass lesion/mass effect.

Vascular: No hyperdense vessel or unexpected calcification.

Skull: Normal. Negative for fracture or focal lesion.

Sinuses/Orbits: Right maxillary mucous retention cyst is noted.

Other: None.

CT CERVICAL SPINE FINDINGS

Alignment: Normal.

Skull base and vertebrae: No acute fracture. No primary bone lesion
or focal pathologic process.

Soft tissues and spinal canal: No prevertebral fluid or swelling. No
visible canal hematoma.

Disc levels: Severe degenerative disc disease is noted at C5-6.
Multiple large bridging osteophytes are noted anteriorly extending
from C4-5 to C7-T1. Large osteophyte formation is also noted
posteriorly extending from C3 to C5.

Upper chest: Negative.

Other: None.
IMPRESSION: No acute intracranial abnormality seen.

Extensive degenerative changes are noted in the cervical spine. No
acute abnormality is noted.

## 2020-06-16 DIAGNOSIS — W6133XA Pecked by chicken, initial encounter: Secondary | ICD-10-CM | POA: Diagnosis not present

## 2020-06-16 DIAGNOSIS — L089 Local infection of the skin and subcutaneous tissue, unspecified: Secondary | ICD-10-CM | POA: Diagnosis not present

## 2020-06-21 DIAGNOSIS — L089 Local infection of the skin and subcutaneous tissue, unspecified: Secondary | ICD-10-CM | POA: Diagnosis not present

## 2020-06-21 DIAGNOSIS — I10 Essential (primary) hypertension: Secondary | ICD-10-CM | POA: Diagnosis not present

## 2020-06-21 DIAGNOSIS — W6133XD Pecked by chicken, subsequent encounter: Secondary | ICD-10-CM | POA: Diagnosis not present

## 2020-06-21 DIAGNOSIS — E663 Overweight: Secondary | ICD-10-CM | POA: Diagnosis not present

## 2020-06-21 DIAGNOSIS — E1169 Type 2 diabetes mellitus with other specified complication: Secondary | ICD-10-CM | POA: Diagnosis not present

## 2020-07-31 DIAGNOSIS — L821 Other seborrheic keratosis: Secondary | ICD-10-CM | POA: Diagnosis not present

## 2020-07-31 DIAGNOSIS — D225 Melanocytic nevi of trunk: Secondary | ICD-10-CM | POA: Diagnosis not present

## 2020-07-31 DIAGNOSIS — Z85828 Personal history of other malignant neoplasm of skin: Secondary | ICD-10-CM | POA: Diagnosis not present

## 2020-07-31 DIAGNOSIS — L570 Actinic keratosis: Secondary | ICD-10-CM | POA: Diagnosis not present

## 2020-07-31 DIAGNOSIS — D1801 Hemangioma of skin and subcutaneous tissue: Secondary | ICD-10-CM | POA: Diagnosis not present

## 2020-10-25 DIAGNOSIS — M1990 Unspecified osteoarthritis, unspecified site: Secondary | ICD-10-CM | POA: Diagnosis not present

## 2020-10-25 DIAGNOSIS — E7800 Pure hypercholesterolemia, unspecified: Secondary | ICD-10-CM | POA: Diagnosis not present

## 2020-10-25 DIAGNOSIS — R413 Other amnesia: Secondary | ICD-10-CM | POA: Diagnosis not present

## 2020-10-25 DIAGNOSIS — E1169 Type 2 diabetes mellitus with other specified complication: Secondary | ICD-10-CM | POA: Diagnosis not present

## 2020-10-25 DIAGNOSIS — Z79899 Other long term (current) drug therapy: Secondary | ICD-10-CM | POA: Diagnosis not present

## 2020-10-25 DIAGNOSIS — I10 Essential (primary) hypertension: Secondary | ICD-10-CM | POA: Diagnosis not present

## 2020-10-25 DIAGNOSIS — E663 Overweight: Secondary | ICD-10-CM | POA: Diagnosis not present

## 2020-11-20 DIAGNOSIS — R509 Fever, unspecified: Secondary | ICD-10-CM | POA: Diagnosis not present

## 2020-11-20 DIAGNOSIS — Z20828 Contact with and (suspected) exposure to other viral communicable diseases: Secondary | ICD-10-CM | POA: Diagnosis not present

## 2020-11-20 DIAGNOSIS — E119 Type 2 diabetes mellitus without complications: Secondary | ICD-10-CM | POA: Diagnosis not present

## 2020-11-20 DIAGNOSIS — R5081 Fever presenting with conditions classified elsewhere: Secondary | ICD-10-CM | POA: Diagnosis not present

## 2020-11-20 DIAGNOSIS — R5383 Other fatigue: Secondary | ICD-10-CM | POA: Diagnosis not present

## 2020-11-20 DIAGNOSIS — Z1152 Encounter for screening for COVID-19: Secondary | ICD-10-CM | POA: Diagnosis not present

## 2020-11-21 ENCOUNTER — Other Ambulatory Visit (HOSPITAL_COMMUNITY): Payer: Self-pay | Admitting: Family Medicine

## 2020-11-21 DIAGNOSIS — E1169 Type 2 diabetes mellitus with other specified complication: Secondary | ICD-10-CM | POA: Diagnosis not present

## 2020-11-21 DIAGNOSIS — M79605 Pain in left leg: Secondary | ICD-10-CM

## 2020-11-21 DIAGNOSIS — L089 Local infection of the skin and subcutaneous tissue, unspecified: Secondary | ICD-10-CM | POA: Diagnosis not present

## 2020-11-22 ENCOUNTER — Ambulatory Visit (HOSPITAL_COMMUNITY)
Admission: RE | Admit: 2020-11-22 | Discharge: 2020-11-22 | Disposition: A | Discharge status: Home / Self Care | Payer: Medicare PPO | Source: Ambulatory Visit | Attending: Family Medicine | Admitting: Family Medicine

## 2020-11-22 ENCOUNTER — Other Ambulatory Visit: Payer: Self-pay

## 2020-11-22 DIAGNOSIS — M79605 Pain in left leg: Secondary | ICD-10-CM | POA: Diagnosis not present

## 2020-11-27 DIAGNOSIS — L089 Local infection of the skin and subcutaneous tissue, unspecified: Secondary | ICD-10-CM | POA: Diagnosis not present

## 2021-01-10 DIAGNOSIS — I10 Essential (primary) hypertension: Secondary | ICD-10-CM | POA: Diagnosis not present

## 2021-01-10 DIAGNOSIS — H5203 Hypermetropia, bilateral: Secondary | ICD-10-CM | POA: Diagnosis not present

## 2021-01-10 DIAGNOSIS — Z961 Presence of intraocular lens: Secondary | ICD-10-CM | POA: Diagnosis not present

## 2021-01-10 DIAGNOSIS — E11319 Type 2 diabetes mellitus with unspecified diabetic retinopathy without macular edema: Secondary | ICD-10-CM | POA: Diagnosis not present

## 2021-01-10 DIAGNOSIS — H35033 Hypertensive retinopathy, bilateral: Secondary | ICD-10-CM | POA: Diagnosis not present

## 2021-01-10 DIAGNOSIS — H524 Presbyopia: Secondary | ICD-10-CM | POA: Diagnosis not present

## 2021-01-10 DIAGNOSIS — Z9849 Cataract extraction status, unspecified eye: Secondary | ICD-10-CM | POA: Diagnosis not present

## 2021-01-10 DIAGNOSIS — E119 Type 2 diabetes mellitus without complications: Secondary | ICD-10-CM | POA: Diagnosis not present

## 2021-01-10 DIAGNOSIS — H52223 Regular astigmatism, bilateral: Secondary | ICD-10-CM | POA: Diagnosis not present

## 2021-02-01 DIAGNOSIS — R0981 Nasal congestion: Secondary | ICD-10-CM | POA: Diagnosis not present

## 2021-02-01 DIAGNOSIS — R058 Other specified cough: Secondary | ICD-10-CM | POA: Diagnosis not present

## 2021-02-01 DIAGNOSIS — R519 Headache, unspecified: Secondary | ICD-10-CM | POA: Diagnosis not present

## 2021-02-01 DIAGNOSIS — J069 Acute upper respiratory infection, unspecified: Secondary | ICD-10-CM | POA: Diagnosis not present

## 2021-02-01 DIAGNOSIS — Z1152 Encounter for screening for COVID-19: Secondary | ICD-10-CM | POA: Diagnosis not present

## 2021-02-01 DIAGNOSIS — G479 Sleep disorder, unspecified: Secondary | ICD-10-CM | POA: Diagnosis not present

## 2021-02-15 DIAGNOSIS — E1169 Type 2 diabetes mellitus with other specified complication: Secondary | ICD-10-CM | POA: Diagnosis not present

## 2021-02-15 DIAGNOSIS — Z125 Encounter for screening for malignant neoplasm of prostate: Secondary | ICD-10-CM | POA: Diagnosis not present

## 2021-02-15 DIAGNOSIS — E7800 Pure hypercholesterolemia, unspecified: Secondary | ICD-10-CM | POA: Diagnosis not present

## 2021-02-15 DIAGNOSIS — I10 Essential (primary) hypertension: Secondary | ICD-10-CM | POA: Diagnosis not present

## 2021-02-23 DIAGNOSIS — Z8546 Personal history of malignant neoplasm of prostate: Secondary | ICD-10-CM | POA: Diagnosis not present

## 2021-02-23 DIAGNOSIS — Z1212 Encounter for screening for malignant neoplasm of rectum: Secondary | ICD-10-CM | POA: Diagnosis not present

## 2021-02-23 DIAGNOSIS — Z23 Encounter for immunization: Secondary | ICD-10-CM | POA: Diagnosis not present

## 2021-02-23 DIAGNOSIS — T466X5A Adverse effect of antihyperlipidemic and antiarteriosclerotic drugs, initial encounter: Secondary | ICD-10-CM | POA: Diagnosis not present

## 2021-02-23 DIAGNOSIS — Z1331 Encounter for screening for depression: Secondary | ICD-10-CM | POA: Diagnosis not present

## 2021-02-23 DIAGNOSIS — E1169 Type 2 diabetes mellitus with other specified complication: Secondary | ICD-10-CM | POA: Diagnosis not present

## 2021-02-23 DIAGNOSIS — E7800 Pure hypercholesterolemia, unspecified: Secondary | ICD-10-CM | POA: Diagnosis not present

## 2021-02-23 DIAGNOSIS — Z1339 Encounter for screening examination for other mental health and behavioral disorders: Secondary | ICD-10-CM | POA: Diagnosis not present

## 2021-02-23 DIAGNOSIS — I10 Essential (primary) hypertension: Secondary | ICD-10-CM | POA: Diagnosis not present

## 2021-02-23 DIAGNOSIS — R82998 Other abnormal findings in urine: Secondary | ICD-10-CM | POA: Diagnosis not present

## 2021-02-23 DIAGNOSIS — Z0000 Encounter for general adult medical examination without abnormal findings: Secondary | ICD-10-CM | POA: Diagnosis not present

## 2021-03-05 DIAGNOSIS — L570 Actinic keratosis: Secondary | ICD-10-CM | POA: Diagnosis not present

## 2021-03-05 DIAGNOSIS — Z85828 Personal history of other malignant neoplasm of skin: Secondary | ICD-10-CM | POA: Diagnosis not present

## 2021-03-05 DIAGNOSIS — L821 Other seborrheic keratosis: Secondary | ICD-10-CM | POA: Diagnosis not present

## 2021-03-19 DIAGNOSIS — R0981 Nasal congestion: Secondary | ICD-10-CM | POA: Diagnosis not present

## 2021-03-19 DIAGNOSIS — E1169 Type 2 diabetes mellitus with other specified complication: Secondary | ICD-10-CM | POA: Diagnosis not present

## 2021-03-19 DIAGNOSIS — R5383 Other fatigue: Secondary | ICD-10-CM | POA: Diagnosis not present

## 2021-03-19 DIAGNOSIS — J309 Allergic rhinitis, unspecified: Secondary | ICD-10-CM | POA: Diagnosis not present

## 2021-03-19 DIAGNOSIS — Z1152 Encounter for screening for COVID-19: Secondary | ICD-10-CM | POA: Diagnosis not present

## 2021-03-19 DIAGNOSIS — R051 Acute cough: Secondary | ICD-10-CM | POA: Diagnosis not present

## 2021-03-19 DIAGNOSIS — J029 Acute pharyngitis, unspecified: Secondary | ICD-10-CM | POA: Diagnosis not present

## 2021-05-21 DIAGNOSIS — M1990 Unspecified osteoarthritis, unspecified site: Secondary | ICD-10-CM | POA: Diagnosis not present

## 2021-05-21 DIAGNOSIS — I10 Essential (primary) hypertension: Secondary | ICD-10-CM | POA: Diagnosis not present

## 2021-05-21 DIAGNOSIS — E1169 Type 2 diabetes mellitus with other specified complication: Secondary | ICD-10-CM | POA: Diagnosis not present

## 2021-05-21 DIAGNOSIS — E119 Type 2 diabetes mellitus without complications: Secondary | ICD-10-CM | POA: Diagnosis not present

## 2021-05-21 DIAGNOSIS — R413 Other amnesia: Secondary | ICD-10-CM | POA: Diagnosis not present

## 2021-05-21 DIAGNOSIS — E663 Overweight: Secondary | ICD-10-CM | POA: Diagnosis not present

## 2021-09-04 DIAGNOSIS — D1801 Hemangioma of skin and subcutaneous tissue: Secondary | ICD-10-CM | POA: Diagnosis not present

## 2021-09-04 DIAGNOSIS — L812 Freckles: Secondary | ICD-10-CM | POA: Diagnosis not present

## 2021-09-04 DIAGNOSIS — D225 Melanocytic nevi of trunk: Secondary | ICD-10-CM | POA: Diagnosis not present

## 2021-09-04 DIAGNOSIS — L570 Actinic keratosis: Secondary | ICD-10-CM | POA: Diagnosis not present

## 2021-09-04 DIAGNOSIS — Z85828 Personal history of other malignant neoplasm of skin: Secondary | ICD-10-CM | POA: Diagnosis not present

## 2021-09-04 DIAGNOSIS — L821 Other seborrheic keratosis: Secondary | ICD-10-CM | POA: Diagnosis not present

## 2021-09-10 DIAGNOSIS — E663 Overweight: Secondary | ICD-10-CM | POA: Diagnosis not present

## 2021-09-10 DIAGNOSIS — I10 Essential (primary) hypertension: Secondary | ICD-10-CM | POA: Diagnosis not present

## 2021-09-10 DIAGNOSIS — M1990 Unspecified osteoarthritis, unspecified site: Secondary | ICD-10-CM | POA: Diagnosis not present

## 2021-09-10 DIAGNOSIS — E1169 Type 2 diabetes mellitus with other specified complication: Secondary | ICD-10-CM | POA: Diagnosis not present

## 2022-01-11 DIAGNOSIS — Z9849 Cataract extraction status, unspecified eye: Secondary | ICD-10-CM | POA: Diagnosis not present

## 2022-01-11 DIAGNOSIS — Z961 Presence of intraocular lens: Secondary | ICD-10-CM | POA: Diagnosis not present

## 2022-01-11 DIAGNOSIS — H524 Presbyopia: Secondary | ICD-10-CM | POA: Diagnosis not present

## 2022-01-11 DIAGNOSIS — H5203 Hypermetropia, bilateral: Secondary | ICD-10-CM | POA: Diagnosis not present

## 2022-01-11 DIAGNOSIS — H53143 Visual discomfort, bilateral: Secondary | ICD-10-CM | POA: Diagnosis not present

## 2022-01-11 DIAGNOSIS — H52223 Regular astigmatism, bilateral: Secondary | ICD-10-CM | POA: Diagnosis not present

## 2022-01-11 DIAGNOSIS — E119 Type 2 diabetes mellitus without complications: Secondary | ICD-10-CM | POA: Diagnosis not present

## 2022-01-16 DIAGNOSIS — Z23 Encounter for immunization: Secondary | ICD-10-CM | POA: Diagnosis not present

## 2022-02-19 DIAGNOSIS — L570 Actinic keratosis: Secondary | ICD-10-CM | POA: Diagnosis not present

## 2022-02-19 DIAGNOSIS — D1801 Hemangioma of skin and subcutaneous tissue: Secondary | ICD-10-CM | POA: Diagnosis not present

## 2022-02-19 DIAGNOSIS — D485 Neoplasm of uncertain behavior of skin: Secondary | ICD-10-CM | POA: Diagnosis not present

## 2022-02-19 DIAGNOSIS — C44629 Squamous cell carcinoma of skin of left upper limb, including shoulder: Secondary | ICD-10-CM | POA: Diagnosis not present

## 2022-02-19 DIAGNOSIS — L853 Xerosis cutis: Secondary | ICD-10-CM | POA: Diagnosis not present

## 2022-02-19 DIAGNOSIS — L812 Freckles: Secondary | ICD-10-CM | POA: Diagnosis not present

## 2022-02-19 DIAGNOSIS — L821 Other seborrheic keratosis: Secondary | ICD-10-CM | POA: Diagnosis not present

## 2022-02-19 DIAGNOSIS — Z85828 Personal history of other malignant neoplasm of skin: Secondary | ICD-10-CM | POA: Diagnosis not present

## 2022-02-20 DIAGNOSIS — Z125 Encounter for screening for malignant neoplasm of prostate: Secondary | ICD-10-CM | POA: Diagnosis not present

## 2022-02-20 DIAGNOSIS — Z79899 Other long term (current) drug therapy: Secondary | ICD-10-CM | POA: Diagnosis not present

## 2022-02-20 DIAGNOSIS — R7989 Other specified abnormal findings of blood chemistry: Secondary | ICD-10-CM | POA: Diagnosis not present

## 2022-02-20 DIAGNOSIS — E7800 Pure hypercholesterolemia, unspecified: Secondary | ICD-10-CM | POA: Diagnosis not present

## 2022-02-20 DIAGNOSIS — I10 Essential (primary) hypertension: Secondary | ICD-10-CM | POA: Diagnosis not present

## 2022-02-20 DIAGNOSIS — E1169 Type 2 diabetes mellitus with other specified complication: Secondary | ICD-10-CM | POA: Diagnosis not present

## 2022-02-25 DIAGNOSIS — E7800 Pure hypercholesterolemia, unspecified: Secondary | ICD-10-CM | POA: Diagnosis not present

## 2022-02-25 DIAGNOSIS — T466X5A Adverse effect of antihyperlipidemic and antiarteriosclerotic drugs, initial encounter: Secondary | ICD-10-CM | POA: Diagnosis not present

## 2022-02-25 DIAGNOSIS — Z1339 Encounter for screening examination for other mental health and behavioral disorders: Secondary | ICD-10-CM | POA: Diagnosis not present

## 2022-02-25 DIAGNOSIS — N529 Male erectile dysfunction, unspecified: Secondary | ICD-10-CM | POA: Diagnosis not present

## 2022-02-25 DIAGNOSIS — E1169 Type 2 diabetes mellitus with other specified complication: Secondary | ICD-10-CM | POA: Diagnosis not present

## 2022-02-25 DIAGNOSIS — R82998 Other abnormal findings in urine: Secondary | ICD-10-CM | POA: Diagnosis not present

## 2022-02-25 DIAGNOSIS — E669 Obesity, unspecified: Secondary | ICD-10-CM | POA: Diagnosis not present

## 2022-02-25 DIAGNOSIS — Z1331 Encounter for screening for depression: Secondary | ICD-10-CM | POA: Diagnosis not present

## 2022-02-25 DIAGNOSIS — Z0000 Encounter for general adult medical examination without abnormal findings: Secondary | ICD-10-CM | POA: Diagnosis not present

## 2022-02-25 DIAGNOSIS — I10 Essential (primary) hypertension: Secondary | ICD-10-CM | POA: Diagnosis not present

## 2022-04-03 DIAGNOSIS — R0981 Nasal congestion: Secondary | ICD-10-CM | POA: Diagnosis not present

## 2022-04-03 DIAGNOSIS — I10 Essential (primary) hypertension: Secondary | ICD-10-CM | POA: Diagnosis not present

## 2022-04-03 DIAGNOSIS — R059 Cough, unspecified: Secondary | ICD-10-CM | POA: Diagnosis not present

## 2022-04-03 DIAGNOSIS — R5383 Other fatigue: Secondary | ICD-10-CM | POA: Diagnosis not present

## 2022-04-03 DIAGNOSIS — R509 Fever, unspecified: Secondary | ICD-10-CM | POA: Diagnosis not present

## 2022-04-03 DIAGNOSIS — B349 Viral infection, unspecified: Secondary | ICD-10-CM | POA: Diagnosis not present

## 2022-04-03 DIAGNOSIS — Z1152 Encounter for screening for COVID-19: Secondary | ICD-10-CM | POA: Diagnosis not present

## 2022-04-22 DIAGNOSIS — M1990 Unspecified osteoarthritis, unspecified site: Secondary | ICD-10-CM | POA: Diagnosis not present

## 2022-04-22 DIAGNOSIS — E663 Overweight: Secondary | ICD-10-CM | POA: Diagnosis not present

## 2022-04-22 DIAGNOSIS — I10 Essential (primary) hypertension: Secondary | ICD-10-CM | POA: Diagnosis not present

## 2022-04-22 DIAGNOSIS — E1169 Type 2 diabetes mellitus with other specified complication: Secondary | ICD-10-CM | POA: Diagnosis not present

## 2022-06-12 DIAGNOSIS — R059 Cough, unspecified: Secondary | ICD-10-CM | POA: Diagnosis not present

## 2022-06-12 DIAGNOSIS — J309 Allergic rhinitis, unspecified: Secondary | ICD-10-CM | POA: Diagnosis not present

## 2022-06-12 DIAGNOSIS — R5383 Other fatigue: Secondary | ICD-10-CM | POA: Diagnosis not present

## 2022-06-12 DIAGNOSIS — Z1152 Encounter for screening for COVID-19: Secondary | ICD-10-CM | POA: Diagnosis not present

## 2022-06-12 DIAGNOSIS — J320 Chronic maxillary sinusitis: Secondary | ICD-10-CM | POA: Diagnosis not present

## 2022-08-21 DIAGNOSIS — Z85828 Personal history of other malignant neoplasm of skin: Secondary | ICD-10-CM | POA: Diagnosis not present

## 2022-08-21 DIAGNOSIS — L570 Actinic keratosis: Secondary | ICD-10-CM | POA: Diagnosis not present

## 2022-08-21 DIAGNOSIS — L821 Other seborrheic keratosis: Secondary | ICD-10-CM | POA: Diagnosis not present

## 2022-08-21 DIAGNOSIS — D225 Melanocytic nevi of trunk: Secondary | ICD-10-CM | POA: Diagnosis not present

## 2022-09-09 DIAGNOSIS — I10 Essential (primary) hypertension: Secondary | ICD-10-CM | POA: Diagnosis not present

## 2022-09-09 DIAGNOSIS — T466X5A Adverse effect of antihyperlipidemic and antiarteriosclerotic drugs, initial encounter: Secondary | ICD-10-CM | POA: Diagnosis not present

## 2022-09-09 DIAGNOSIS — E669 Obesity, unspecified: Secondary | ICD-10-CM | POA: Diagnosis not present

## 2022-09-09 DIAGNOSIS — E7800 Pure hypercholesterolemia, unspecified: Secondary | ICD-10-CM | POA: Diagnosis not present

## 2022-09-09 DIAGNOSIS — J302 Other seasonal allergic rhinitis: Secondary | ICD-10-CM | POA: Diagnosis not present

## 2022-09-09 DIAGNOSIS — N529 Male erectile dysfunction, unspecified: Secondary | ICD-10-CM | POA: Diagnosis not present

## 2022-09-09 DIAGNOSIS — E1169 Type 2 diabetes mellitus with other specified complication: Secondary | ICD-10-CM | POA: Diagnosis not present

## 2022-09-23 DIAGNOSIS — J029 Acute pharyngitis, unspecified: Secondary | ICD-10-CM | POA: Diagnosis not present

## 2022-09-23 DIAGNOSIS — J309 Allergic rhinitis, unspecified: Secondary | ICD-10-CM | POA: Diagnosis not present

## 2022-09-23 DIAGNOSIS — L089 Local infection of the skin and subcutaneous tissue, unspecified: Secondary | ICD-10-CM | POA: Diagnosis not present

## 2022-09-23 DIAGNOSIS — J320 Chronic maxillary sinusitis: Secondary | ICD-10-CM | POA: Diagnosis not present

## 2022-09-23 DIAGNOSIS — E1169 Type 2 diabetes mellitus with other specified complication: Secondary | ICD-10-CM | POA: Diagnosis not present

## 2022-11-25 DIAGNOSIS — R300 Dysuria: Secondary | ICD-10-CM | POA: Diagnosis not present

## 2022-11-25 DIAGNOSIS — R319 Hematuria, unspecified: Secondary | ICD-10-CM | POA: Diagnosis not present

## 2022-12-26 DIAGNOSIS — M791 Myalgia, unspecified site: Secondary | ICD-10-CM | POA: Diagnosis not present

## 2022-12-26 DIAGNOSIS — B349 Viral infection, unspecified: Secondary | ICD-10-CM | POA: Diagnosis not present

## 2022-12-26 DIAGNOSIS — R531 Weakness: Secondary | ICD-10-CM | POA: Diagnosis not present

## 2022-12-26 DIAGNOSIS — R638 Other symptoms and signs concerning food and fluid intake: Secondary | ICD-10-CM | POA: Diagnosis not present

## 2022-12-26 DIAGNOSIS — R509 Fever, unspecified: Secondary | ICD-10-CM | POA: Diagnosis not present

## 2022-12-26 DIAGNOSIS — R0981 Nasal congestion: Secondary | ICD-10-CM | POA: Diagnosis not present

## 2022-12-26 DIAGNOSIS — Z1152 Encounter for screening for COVID-19: Secondary | ICD-10-CM | POA: Diagnosis not present

## 2022-12-27 DIAGNOSIS — L089 Local infection of the skin and subcutaneous tissue, unspecified: Secondary | ICD-10-CM | POA: Diagnosis not present

## 2022-12-27 DIAGNOSIS — L0390 Cellulitis, unspecified: Secondary | ICD-10-CM | POA: Diagnosis not present

## 2022-12-27 DIAGNOSIS — E1169 Type 2 diabetes mellitus with other specified complication: Secondary | ICD-10-CM | POA: Diagnosis not present

## 2022-12-27 DIAGNOSIS — M79605 Pain in left leg: Secondary | ICD-10-CM | POA: Diagnosis not present

## 2023-01-22 DIAGNOSIS — I10 Essential (primary) hypertension: Secondary | ICD-10-CM | POA: Diagnosis not present

## 2023-01-22 DIAGNOSIS — H52223 Regular astigmatism, bilateral: Secondary | ICD-10-CM | POA: Diagnosis not present

## 2023-01-22 DIAGNOSIS — Z9849 Cataract extraction status, unspecified eye: Secondary | ICD-10-CM | POA: Diagnosis not present

## 2023-01-22 DIAGNOSIS — H524 Presbyopia: Secondary | ICD-10-CM | POA: Diagnosis not present

## 2023-01-22 DIAGNOSIS — E119 Type 2 diabetes mellitus without complications: Secondary | ICD-10-CM | POA: Diagnosis not present

## 2023-01-22 DIAGNOSIS — H35033 Hypertensive retinopathy, bilateral: Secondary | ICD-10-CM | POA: Diagnosis not present

## 2023-01-22 DIAGNOSIS — H53143 Visual discomfort, bilateral: Secondary | ICD-10-CM | POA: Diagnosis not present

## 2023-01-22 DIAGNOSIS — H5203 Hypermetropia, bilateral: Secondary | ICD-10-CM | POA: Diagnosis not present

## 2023-01-22 DIAGNOSIS — Z961 Presence of intraocular lens: Secondary | ICD-10-CM | POA: Diagnosis not present

## 2023-02-26 DIAGNOSIS — D225 Melanocytic nevi of trunk: Secondary | ICD-10-CM | POA: Diagnosis not present

## 2023-02-26 DIAGNOSIS — L570 Actinic keratosis: Secondary | ICD-10-CM | POA: Diagnosis not present

## 2023-02-26 DIAGNOSIS — L821 Other seborrheic keratosis: Secondary | ICD-10-CM | POA: Diagnosis not present

## 2023-02-26 DIAGNOSIS — Z85828 Personal history of other malignant neoplasm of skin: Secondary | ICD-10-CM | POA: Diagnosis not present

## 2023-03-03 DIAGNOSIS — Z125 Encounter for screening for malignant neoplasm of prostate: Secondary | ICD-10-CM | POA: Diagnosis not present

## 2023-03-03 DIAGNOSIS — Z8546 Personal history of malignant neoplasm of prostate: Secondary | ICD-10-CM | POA: Diagnosis not present

## 2023-03-03 DIAGNOSIS — I10 Essential (primary) hypertension: Secondary | ICD-10-CM | POA: Diagnosis not present

## 2023-03-03 DIAGNOSIS — R7989 Other specified abnormal findings of blood chemistry: Secondary | ICD-10-CM | POA: Diagnosis not present

## 2023-03-03 DIAGNOSIS — E7800 Pure hypercholesterolemia, unspecified: Secondary | ICD-10-CM | POA: Diagnosis not present

## 2023-03-03 DIAGNOSIS — E1169 Type 2 diabetes mellitus with other specified complication: Secondary | ICD-10-CM | POA: Diagnosis not present

## 2023-03-03 DIAGNOSIS — E669 Obesity, unspecified: Secondary | ICD-10-CM | POA: Diagnosis not present

## 2023-03-10 DIAGNOSIS — Z1331 Encounter for screening for depression: Secondary | ICD-10-CM | POA: Diagnosis not present

## 2023-03-10 DIAGNOSIS — C61 Malignant neoplasm of prostate: Secondary | ICD-10-CM | POA: Diagnosis not present

## 2023-03-10 DIAGNOSIS — I10 Essential (primary) hypertension: Secondary | ICD-10-CM | POA: Diagnosis not present

## 2023-03-10 DIAGNOSIS — Z0000 Encounter for general adult medical examination without abnormal findings: Secondary | ICD-10-CM | POA: Diagnosis not present

## 2023-03-10 DIAGNOSIS — Z1339 Encounter for screening examination for other mental health and behavioral disorders: Secondary | ICD-10-CM | POA: Diagnosis not present

## 2023-03-10 DIAGNOSIS — E7800 Pure hypercholesterolemia, unspecified: Secondary | ICD-10-CM | POA: Diagnosis not present

## 2023-03-10 DIAGNOSIS — E1169 Type 2 diabetes mellitus with other specified complication: Secondary | ICD-10-CM | POA: Diagnosis not present

## 2023-03-10 DIAGNOSIS — R82998 Other abnormal findings in urine: Secondary | ICD-10-CM | POA: Diagnosis not present

## 2023-03-10 DIAGNOSIS — E663 Overweight: Secondary | ICD-10-CM | POA: Diagnosis not present

## 2023-06-16 DIAGNOSIS — E663 Overweight: Secondary | ICD-10-CM | POA: Diagnosis not present

## 2023-06-16 DIAGNOSIS — E1169 Type 2 diabetes mellitus with other specified complication: Secondary | ICD-10-CM | POA: Diagnosis not present

## 2023-06-16 DIAGNOSIS — M1990 Unspecified osteoarthritis, unspecified site: Secondary | ICD-10-CM | POA: Diagnosis not present

## 2023-06-16 DIAGNOSIS — T466X5A Adverse effect of antihyperlipidemic and antiarteriosclerotic drugs, initial encounter: Secondary | ICD-10-CM | POA: Diagnosis not present

## 2023-06-16 DIAGNOSIS — E7800 Pure hypercholesterolemia, unspecified: Secondary | ICD-10-CM | POA: Diagnosis not present

## 2023-07-17 DIAGNOSIS — R0981 Nasal congestion: Secondary | ICD-10-CM | POA: Diagnosis not present

## 2023-07-17 DIAGNOSIS — Z1152 Encounter for screening for COVID-19: Secondary | ICD-10-CM | POA: Diagnosis not present

## 2023-07-17 DIAGNOSIS — R5383 Other fatigue: Secondary | ICD-10-CM | POA: Diagnosis not present

## 2023-07-17 DIAGNOSIS — G43909 Migraine, unspecified, not intractable, without status migrainosus: Secondary | ICD-10-CM | POA: Diagnosis not present

## 2023-07-17 DIAGNOSIS — J101 Influenza due to other identified influenza virus with other respiratory manifestations: Secondary | ICD-10-CM | POA: Diagnosis not present

## 2023-07-17 DIAGNOSIS — R059 Cough, unspecified: Secondary | ICD-10-CM | POA: Diagnosis not present

## 2023-07-17 DIAGNOSIS — J029 Acute pharyngitis, unspecified: Secondary | ICD-10-CM | POA: Diagnosis not present

## 2023-07-17 DIAGNOSIS — M791 Myalgia, unspecified site: Secondary | ICD-10-CM | POA: Diagnosis not present

## 2023-08-27 DIAGNOSIS — L853 Xerosis cutis: Secondary | ICD-10-CM | POA: Diagnosis not present

## 2023-08-27 DIAGNOSIS — D485 Neoplasm of uncertain behavior of skin: Secondary | ICD-10-CM | POA: Diagnosis not present

## 2023-08-27 DIAGNOSIS — D0461 Carcinoma in situ of skin of right upper limb, including shoulder: Secondary | ICD-10-CM | POA: Diagnosis not present

## 2023-08-27 DIAGNOSIS — L821 Other seborrheic keratosis: Secondary | ICD-10-CM | POA: Diagnosis not present

## 2023-08-27 DIAGNOSIS — D225 Melanocytic nevi of trunk: Secondary | ICD-10-CM | POA: Diagnosis not present

## 2023-08-27 DIAGNOSIS — L570 Actinic keratosis: Secondary | ICD-10-CM | POA: Diagnosis not present

## 2023-08-27 DIAGNOSIS — Z85828 Personal history of other malignant neoplasm of skin: Secondary | ICD-10-CM | POA: Diagnosis not present

## 2023-09-24 DIAGNOSIS — I10 Essential (primary) hypertension: Secondary | ICD-10-CM | POA: Diagnosis not present

## 2023-09-24 DIAGNOSIS — C61 Malignant neoplasm of prostate: Secondary | ICD-10-CM | POA: Diagnosis not present

## 2023-09-24 DIAGNOSIS — E7800 Pure hypercholesterolemia, unspecified: Secondary | ICD-10-CM | POA: Diagnosis not present

## 2023-09-24 DIAGNOSIS — E1169 Type 2 diabetes mellitus with other specified complication: Secondary | ICD-10-CM | POA: Diagnosis not present

## 2023-12-15 DIAGNOSIS — T466X5A Adverse effect of antihyperlipidemic and antiarteriosclerotic drugs, initial encounter: Secondary | ICD-10-CM | POA: Diagnosis not present

## 2023-12-15 DIAGNOSIS — E1169 Type 2 diabetes mellitus with other specified complication: Secondary | ICD-10-CM | POA: Diagnosis not present

## 2023-12-15 DIAGNOSIS — M1990 Unspecified osteoarthritis, unspecified site: Secondary | ICD-10-CM | POA: Diagnosis not present

## 2023-12-15 DIAGNOSIS — E7800 Pure hypercholesterolemia, unspecified: Secondary | ICD-10-CM | POA: Diagnosis not present

## 2023-12-15 DIAGNOSIS — I10 Essential (primary) hypertension: Secondary | ICD-10-CM | POA: Diagnosis not present

## 2024-02-26 DIAGNOSIS — L853 Xerosis cutis: Secondary | ICD-10-CM | POA: Diagnosis not present

## 2024-02-26 DIAGNOSIS — D1801 Hemangioma of skin and subcutaneous tissue: Secondary | ICD-10-CM | POA: Diagnosis not present

## 2024-02-26 DIAGNOSIS — L821 Other seborrheic keratosis: Secondary | ICD-10-CM | POA: Diagnosis not present

## 2024-02-26 DIAGNOSIS — D0462 Carcinoma in situ of skin of left upper limb, including shoulder: Secondary | ICD-10-CM | POA: Diagnosis not present

## 2024-02-26 DIAGNOSIS — D485 Neoplasm of uncertain behavior of skin: Secondary | ICD-10-CM | POA: Diagnosis not present

## 2024-02-26 DIAGNOSIS — Z85828 Personal history of other malignant neoplasm of skin: Secondary | ICD-10-CM | POA: Diagnosis not present

## 2024-02-26 DIAGNOSIS — L570 Actinic keratosis: Secondary | ICD-10-CM | POA: Diagnosis not present

## 2024-03-15 DIAGNOSIS — R82998 Other abnormal findings in urine: Secondary | ICD-10-CM | POA: Diagnosis not present

## 2024-03-15 DIAGNOSIS — I10 Essential (primary) hypertension: Secondary | ICD-10-CM | POA: Diagnosis not present
